# Patient Record
Sex: Female | Born: 1995 | Race: Black or African American
Health system: Southern US, Community
[De-identification: ages and names within clinical notes are randomized; demographics above are authoritative.]

## PROBLEM LIST (undated history)

## (undated) DIAGNOSIS — R1115 Cyclical vomiting syndrome unrelated to migraine: Secondary | ICD-10-CM

## (undated) DIAGNOSIS — Z973 Presence of spectacles and contact lenses: Secondary | ICD-10-CM

## (undated) DIAGNOSIS — T7840XA Allergy, unspecified, initial encounter: Secondary | ICD-10-CM

## (undated) DIAGNOSIS — F329 Major depressive disorder, single episode, unspecified: Secondary | ICD-10-CM

## (undated) DIAGNOSIS — R110 Nausea: Secondary | ICD-10-CM

## (undated) HISTORY — DX: Presence of spectacles and contact lenses: Z97.3

## (undated) HISTORY — DX: Allergy, unspecified, initial encounter: T78.40XA

## (undated) HISTORY — DX: Nausea: R11.0

## (undated) HISTORY — PX: ADENOIDECTOMY: SUR15

---
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
## 2014-03-17 ENCOUNTER — Encounter: Payer: Self-pay | Admitting: Medical

## 2014-03-17 ENCOUNTER — Ambulatory Visit (INDEPENDENT_AMBULATORY_CARE_PROVIDER_SITE_OTHER): Payer: Self-pay | Admitting: Medical

## 2014-03-17 VITALS — BP 110/70 | HR 96 | Temp 98.1°F | Resp 16 | Wt 154.0 lb

## 2014-03-17 DIAGNOSIS — J309 Allergic rhinitis, unspecified: Secondary | ICD-10-CM

## 2014-03-17 DIAGNOSIS — G43009 Migraine without aura, not intractable, without status migrainosus: Secondary | ICD-10-CM

## 2014-03-17 MED ORDER — MOMETASONE FUROATE 50 MCG/ACT NA SUSP: 2.0000 | Freq: Every day | NASAL | Status: AC

## 2014-03-17 MED ORDER — ZOLMITRIPTAN 5 MG PO TABS: 5.0000 mg | ORAL_TABLET | ORAL | Status: AC | PRN

## 2014-03-17 NOTE — Progress Notes (Signed)
Subjective:    Sandra Scott is a 18 y.o. female who presents as a new patient.     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  Needs primary care provider.  Here for hx/o migraines, needs refills.  Gets migraines every now and then, can last 2-3 days.  Had migraine few days ago that has resolved with Advil for migraine  First diagnosed with migraines 2 years ago, has seen both Primary care and neurology in [place].  Was diagnosed with migraines without aura.   Was not prescribed daily preventative mediation, only given Zomig for acute migraine.   zomig usually works ok.   Usually gets a migraine every few months.  Usually gets photophobia, phonophobia, nausea, usually gets temporal or frontal, sometimes both.  Thobbing.  Pain is constant, motion in general can aggravate the headache.  At times has gotten tingling in left foot.  Had been seen in the hospital for 1.5 wk for left leg numbness, rule out stoke, felt like this could have been caused by migraines.  Sometimes with migraines gets dizzy.  Sometime gets migraine the week before her period.  Her migraines triggers are unknown.  The patient denies depression, loss of balance, muscle weakness, speech difficulties and vision problems.  Family history includes no known family members with significant headaches.  Hx/o depression, doing fine currently, in remission, no medication.  Life is good, happy, lives with 3 room mates.  Plans to pursue nursing career.  The following portions of the patient's history were reviewed and updated as appropriate: allergies, current medications, past family history, past medical history, past social history, past surgical history and problem list.  Review of Systems Constitutional: -fever, -chills, -sweats, -unexpected weight change ENT: +runny nose, +head congestion, +sneezing, -ear pain or pressure, -sore throat, -hearing loss, -sinus problems Cardiology:  -chest pain, -palpitations, -edema, -  hx/o pulsating artery in temple Respiratory: -cough, -shortness of breath, -wheezing Gastroenterology: -abdominal pain, -nausea, -vomiting, -diarrhea, -constipation  Hematology: -bleeding or bruising problems Musculoskeletal: - no stiff neck, -arthralgias, -myalgias, -joint swelling, -back pain Ophthalmology: -vision changes, - vision loss, -double vision Urology: -dysuria, -difficulty urinating, -hematuria, -urinary frequency, -urgency Neurology: -weakness, -tingling, -numbness, -speech changes, - facial changes, -urinary or bowel incontinence, -LOC, -altered mental status.   Other: No recent head injury or concussion, no recent fasting from meals, no recent change in caffeine use, headaches are not worsened by exertion Psych: -no recent change in stress level     Objective:   Physical Exam  Filed Vitals:   03/17/14 1353  BP: 110/70  Pulse: 96  Temp: 98.1 F (36.7 C)  Resp: 16    General appearance: alert, no distress, WD/WN HEENT: normocephalic, sclerae anicteric, PERRLA, EOMi, nares patent, no discharge or erythema, pharynx normal Oral cavity: MMM, no lesions Neck: supple, no lymphadenopathy, no thyromegaly, no masses, no bruits Heart: RRR, normal S1, S2, no murmurs Lungs: CTA bilaterally, no wheezes, rhonchi, or rales Extremities: unremarkable Pulses: 2+ symmetric, upper and lower extremities Neurological: alert, oriented x 3, CN2-12 intact, strength normal upper extremities and lower extremities, sensation normal throughout, DTRs 2+ throughout, no cerebellar signs, gait normal Psychiatric: normal affect, behavior normal, pleasant     Assessment:   Encounter Diagnoses  Name Primary?  . Migraine without aura and without status migrainosus, not intractable Yes  . Allergic rhinitis, unspecified allergic rhinitis type       Plan:   Migraines - discussed diagnosis, treatment recommendations, prevention, headache diary.  Advised c/t  Zomig for acute migraines, discussed  proper use, motrin the week before periods, c/t daily allergy medications, and f/u if worsening frequency or intractable migraines  Allergic rhinitis - c/t daily OTC ant histamine, trial of Nasonex spray.   F/u with call back in 3-4 wk.

## 2015-04-18 IMAGING — CR DG LUMBAR SPINE COMPLETE 4+V
5 series · 5 of 5 positions shown · non-contrast
Comparison: None.

CLINICAL DATA: Low back pain, no injury

EXAM:
LUMBAR SPINE - COMPLETE 4+ VIEW

[t lumbar spine ap]
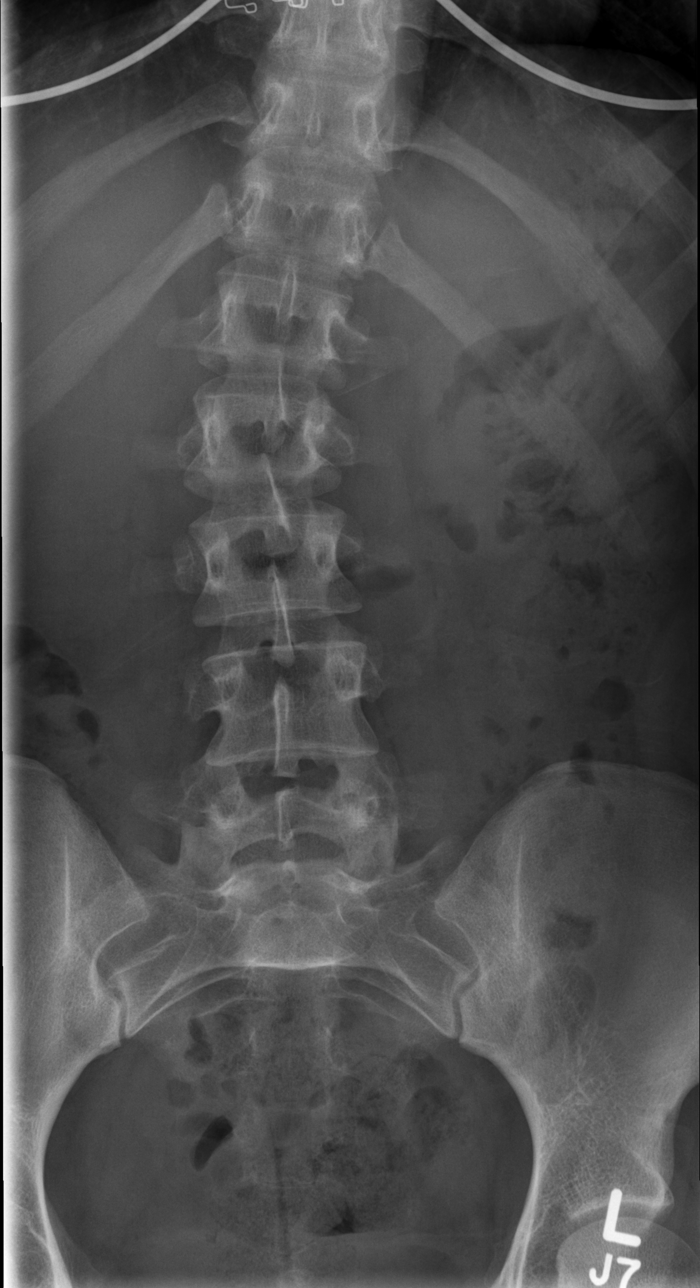

[t lumbar spine obl (1 of 2)]
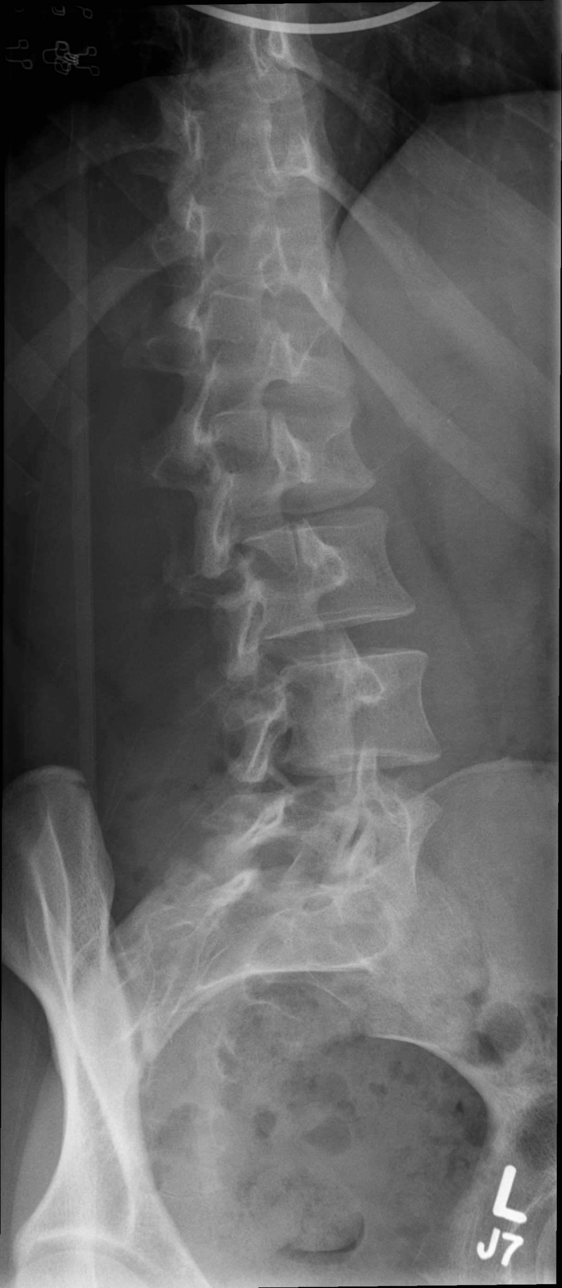

[t lumbar spine lat]
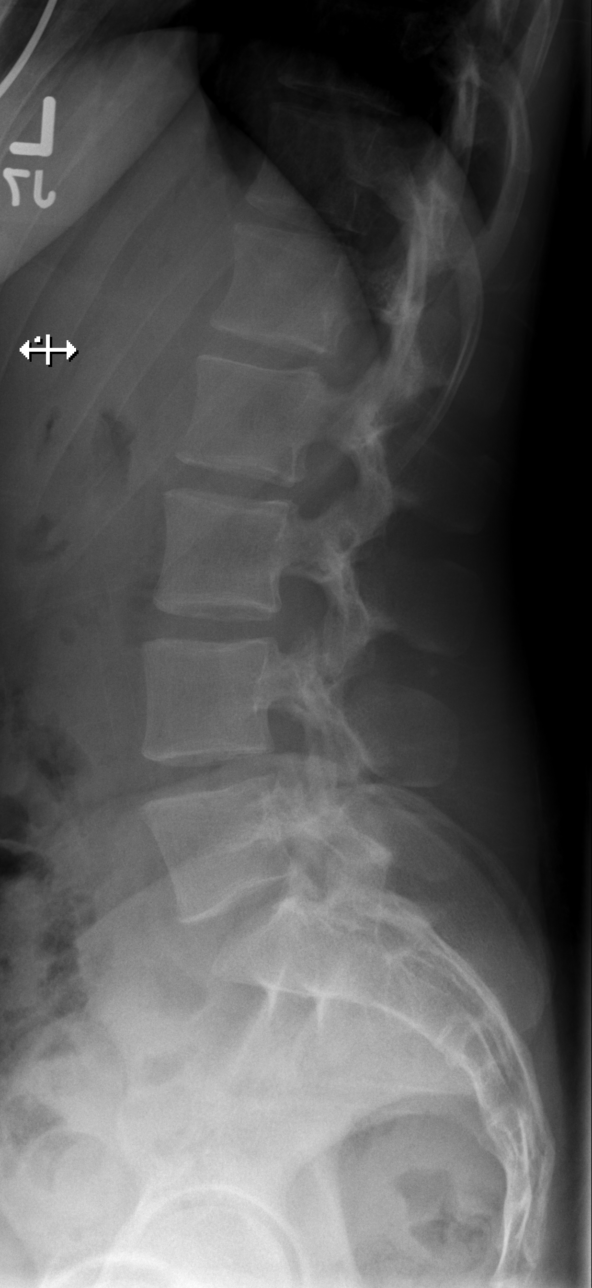

[t lumbar l-5 s-1 spot]
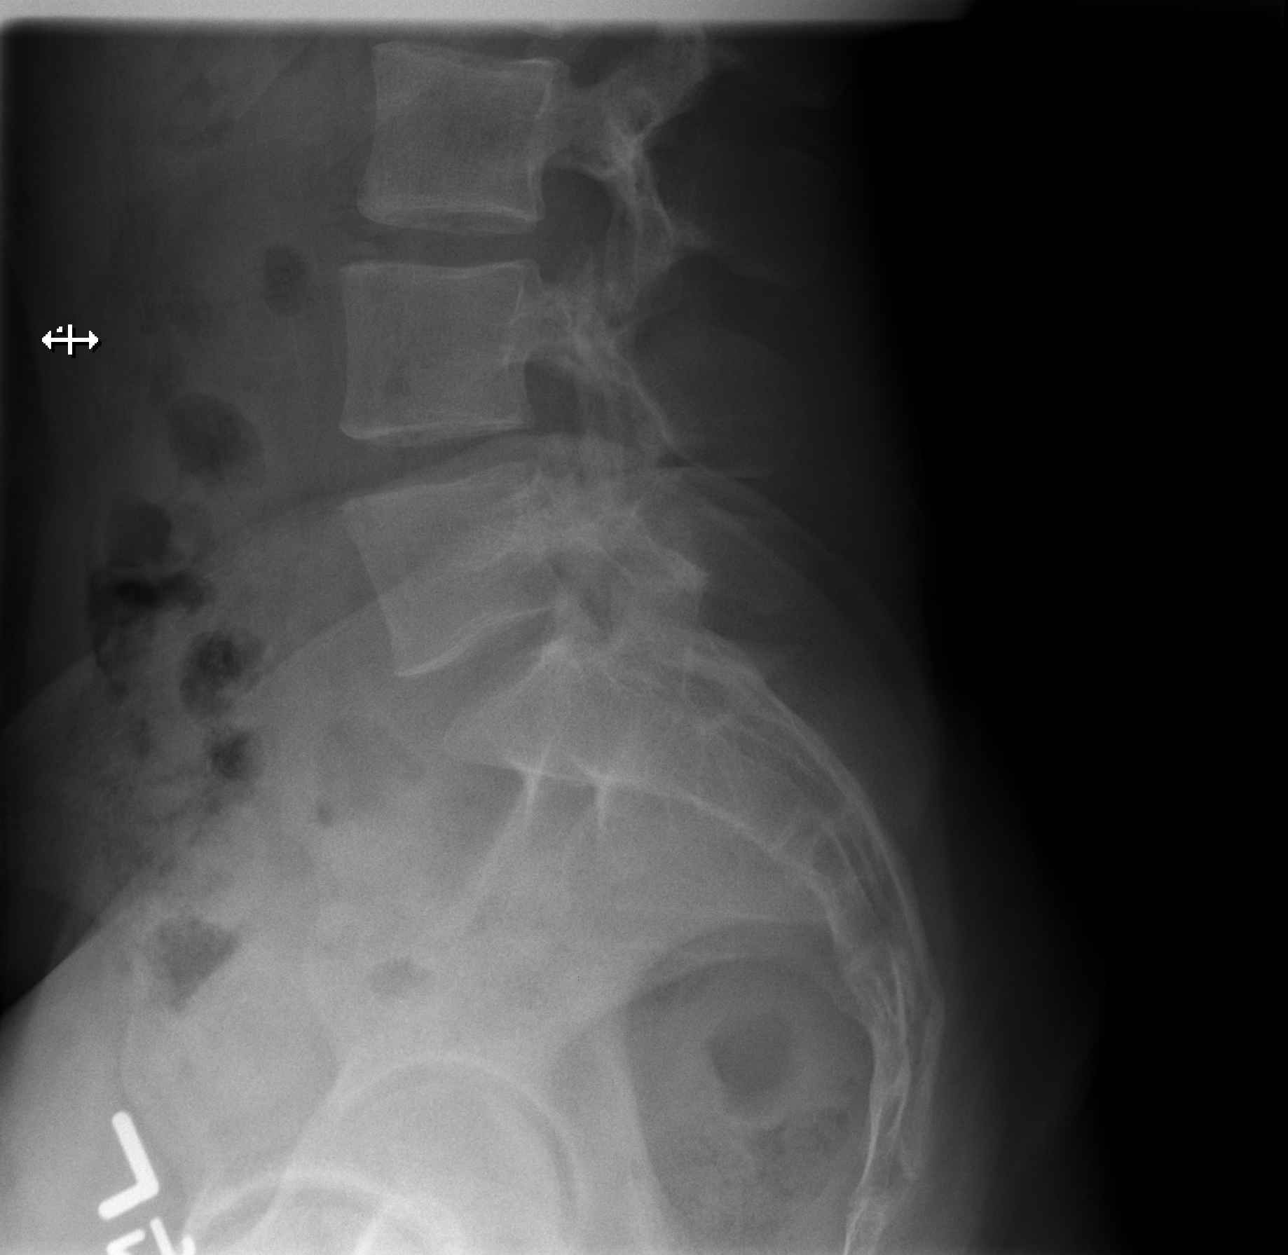

[t lumbar spine obl (2 of 2)]
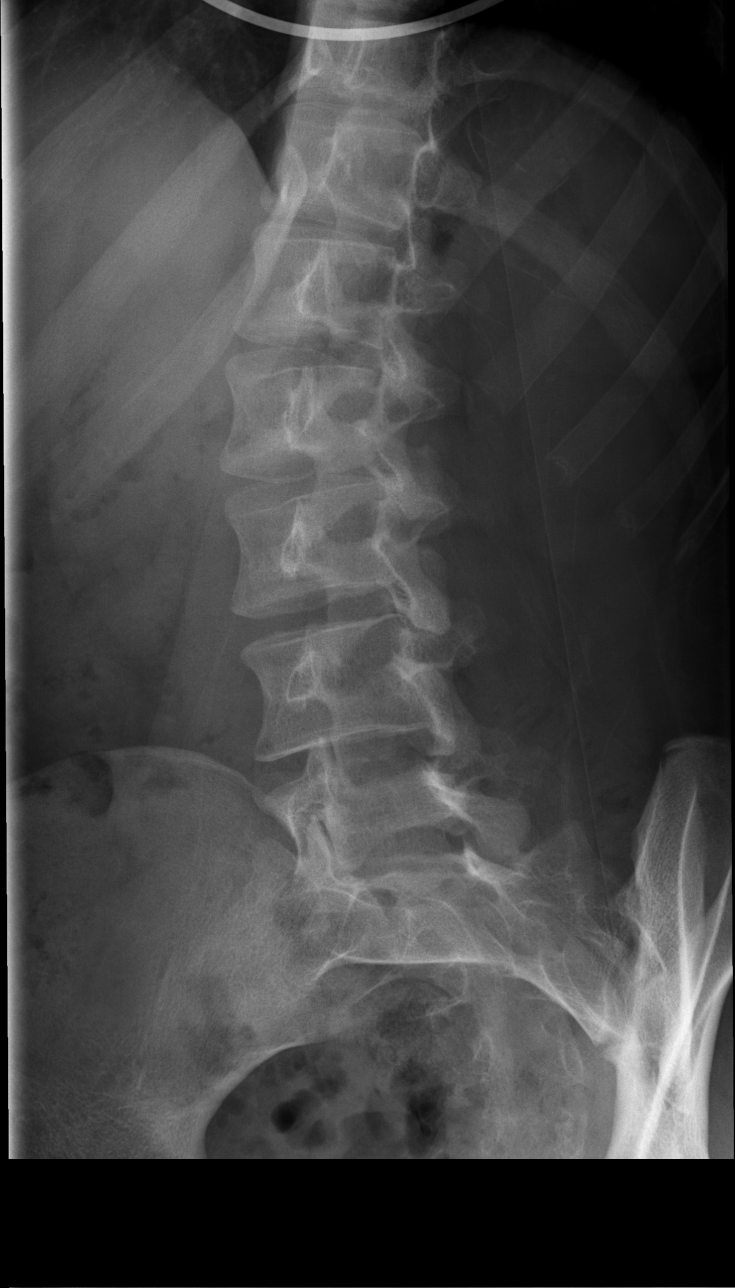

[5 of 5 positions shown; findings below may reference images not displayed]

FINDINGS: 12 degrees of dextroscoliosis at L2. No focal vertebral body
abnormality.

Negative for fracture or mass. Normal alignment and no significant
disc degeneration. Negative for pars defect.
IMPRESSION: Mild dextroscoliosis.

## 2021-01-07 ENCOUNTER — Emergency Department: Payer: Self-pay

## 2021-01-07 ENCOUNTER — Emergency Department
Admission: EM | Admit: 2021-01-07 | Discharge: 2021-01-07 | Disposition: A | Discharge status: Home / Self Care | Payer: Self-pay | Attending: Emergency Medicine | Admitting: Emergency Medicine

## 2021-01-07 DIAGNOSIS — E860 Dehydration: Secondary | ICD-10-CM | POA: Insufficient documentation

## 2021-01-07 DIAGNOSIS — R112 Nausea with vomiting, unspecified: Secondary | ICD-10-CM | POA: Insufficient documentation

## 2021-01-07 LAB — CBC AND DIFFERENTIAL
Absolute NRBC: 0 10*3/uL (ref 0.00–0.00)
Basophils Absolute Automated: 0.03 10*3/uL (ref 0.00–0.08)
Basophils Automated: 0.2 %
Eosinophils Absolute Automated: 0.03 10*3/uL (ref 0.00–0.44)
Eosinophils Automated: 0.2 %
Hematocrit: 41.2 % (ref 34.7–43.7)
Hgb: 14.5 g/dL (ref 11.4–14.8)
Immature Granulocytes Absolute: 0.04 10*3/uL (ref 0.00–0.07)
Immature Granulocytes: 0.3 %
Lymphocytes Absolute Automated: 3.06 10*3/uL (ref 0.42–3.22)
Lymphocytes Automated: 25.1 %
MCH: 31.7 pg (ref 25.1–33.5)
MCHC: 35.2 g/dL (ref 31.5–35.8)
MCV: 90 fL (ref 78.0–96.0)
MPV: 9.5 fL (ref 8.9–12.5)
Monocytes Absolute Automated: 0.76 10*3/uL (ref 0.21–0.85)
Monocytes: 6.2 %
Neutrophils Absolute: 8.28 10*3/uL — ABNORMAL HIGH (ref 1.10–6.33)
Neutrophils: 68 %
Nucleated RBC: 0 /100 WBC (ref 0.0–0.0)
Platelets: 325 10*3/uL (ref 142–346)
RBC: 4.58 10*6/uL (ref 3.90–5.10)
RDW: 12 % (ref 11–15)
WBC: 12.2 10*3/uL — ABNORMAL HIGH (ref 3.10–9.50)

## 2021-01-07 LAB — COMPREHENSIVE METABOLIC PANEL
ALT: 19 U/L (ref 0–55)
AST (SGOT): 24 U/L (ref 5–34)
Albumin/Globulin Ratio: 1.2 (ref 0.9–2.2)
Albumin: 4.2 g/dL (ref 3.5–5.0)
Alkaline Phosphatase: 39 U/L (ref 37–117)
Anion Gap: 16 — ABNORMAL HIGH (ref 5.0–15.0)
BUN: 9 mg/dL (ref 7.0–19.0)
Bilirubin, Total: 0.6 mg/dL (ref 0.2–1.2)
CO2: 15 mEq/L — ABNORMAL LOW (ref 22–29)
Calcium: 9.6 mg/dL (ref 8.5–10.5)
Chloride: 104 mEq/L (ref 100–111)
Creatinine: 0.8 mg/dL (ref 0.6–1.0)
Globulin: 3.6 g/dL (ref 2.0–3.6)
Glucose: 106 mg/dL — ABNORMAL HIGH (ref 70–100)
Potassium: 3.6 mEq/L (ref 3.5–5.1)
Protein, Total: 7.8 g/dL (ref 6.0–8.3)
Sodium: 135 mEq/L — ABNORMAL LOW (ref 136–145)

## 2021-01-07 LAB — URINALYSIS REFLEX TO MICROSCOPIC EXAM - REFLEX TO CULTURE
Bilirubin, UA: NEGATIVE
Glucose, UA: NEGATIVE
Ketones UA: >=80 — AB
Leukocyte Esterase, UA: NEGATIVE
Nitrite, UA: NEGATIVE
Protein, UR: 30 — AB
Specific Gravity UA: 1.031 (ref 1.001–1.035)
Urine pH: 6 (ref 5.0–8.0)
Urobilinogen, UA: NORMAL mg/dL (ref 0.2–2.0)

## 2021-01-07 LAB — POCT PREGNANCY TEST, URINE HCG: POCT Pregnancy HCG Test, UR: NEGATIVE

## 2021-01-07 LAB — COVID-19 (SARS-COV-2) & INFLUENZA  A/B, NAA (ROCHE LIAT)
Influenza A: NOT DETECTED
Influenza B: NOT DETECTED
SARS CoV 2 Overall Result: NOT DETECTED

## 2021-01-07 LAB — GFR: EGFR: >60

## 2021-01-07 LAB — LIPASE: Lipase: 30 U/L (ref 8–78)

## 2021-01-07 MED ORDER — ONDANSETRON HCL 4 MG/2ML IJ SOLN
4.0000 mg | Freq: Once | INTRAMUSCULAR | Status: AC
Start: 2021-01-07 — End: 2021-01-07
  Administered 2021-01-07: 4 mg via INTRAVENOUS
  Filled 2021-01-07: qty 2

## 2021-01-07 MED ORDER — ONDANSETRON 4 MG PO TBDP
4.0000 mg | ORAL_TABLET | Freq: Four times a day (QID) | ORAL | 0 refills | Status: AC | PRN
Start: 2021-01-07 — End: ?

## 2021-01-07 MED ORDER — SODIUM CHLORIDE 0.9 % IV BOLUS
1000.0000 mL | Freq: Once | INTRAVENOUS | Status: AC
Start: 2021-01-07 — End: 2021-01-07
  Administered 2021-01-07: 1000 mL via INTRAVENOUS

## 2021-01-07 MED ORDER — ONDANSETRON 4 MG PO TBDP
4.0000 mg | ORAL_TABLET | Freq: Four times a day (QID) | ORAL | 0 refills | Status: DC | PRN
Start: 2021-01-07 — End: 2021-01-07

## 2021-01-07 MED ORDER — KETOROLAC TROMETHAMINE 30 MG/ML IJ SOLN
15.0000 mg | Freq: Once | INTRAMUSCULAR | Status: AC
Start: 2021-01-07 — End: 2021-01-07
  Administered 2021-01-07: 15 mg via INTRAVENOUS
  Filled 2021-01-07: qty 1

## 2021-01-07 MED ORDER — ACETAMINOPHEN 500 MG PO TABS
1000.0000 mg | ORAL_TABLET | Freq: Once | ORAL | Status: DC
Start: 2021-01-07 — End: 2021-01-07

## 2021-01-07 MED ORDER — IOHEXOL 350 MG/ML IV SOLN
80.0000 mL | Freq: Once | INTRAVENOUS | Status: AC | PRN
Start: 2021-01-07 — End: 2021-01-07
  Administered 2021-01-07: 80 mL via INTRAVENOUS

## 2021-01-07 NOTE — ED Notes (Signed)
Bed: N 42  Expected date:   Expected time:   Means of arrival:   Comments:  RN has T & T2

## 2021-01-07 NOTE — ED Provider Notes (Signed)
[facility] EMERGENCY DEPARTMENT APP H&P         CLINICAL SUMMARY          Diagnosis:    .     Final diagnoses:   Nausea and vomiting, unspecified vomiting type   Dehydration         MDM Notes:      Carmen Jacobs is a 25 y.o. female w/ no pmhx who presents with nausea, vomiting, abdominal pain underneath her belly button x 2 days.     DDx-              Initial differential diagnosis included: Viral gastroenteritis, appendicitis, diverticulitis, colitis, pregnancy, electrolyte derangements, dehydration    Discussed with attending MD Mat Carne.   Patient appearing nontoxic.  Afebrile, vital signs stable.  Abdomen soft, mild diffuse lower abdomen tenderness.   Labs WBC 12K, normal renal function.   COVID/flu negative.  UA w/o infection.  Signed out to Dr. Mat Carne pending CTAP.    Counseling: I have spoken with the patient and discussed today's findings, in addition to providing specific details for the plan of care. Questions are answered and the patient agrees with the plan.     This patient was seen and evaluated during the SARS-CoV-2 pandemic.         Disposition:       ED Disposition       ED Disposition   Discharge    Condition   --    Date/Time   Fri Jan 07, 2021  6:16 AM    Comment   Joice Lofts Duffy Bruce discharge to home/self care.    Condition at disposition: Stable                 Discharge         Discharge Prescriptions       Medication Sig Dispense Auth. Provider    ondansetron (ZOFRAN-ODT) 4 MG disintegrating tablet  (Status: Discontinued) Take 1 tablet (4 mg total) by mouth every 6 (six) hours as needed for Nausea 6 tablet Rudi Rummage, MD    ondansetron (ZOFRAN-ODT) 4 MG disintegrating tablet  (Status: Discontinued) Take 1 tablet (4 mg total) by mouth every 6 (six) hours as needed for Nausea 8 tablet Rudi Rummage, MD    ondansetron (ZOFRAN-ODT) 4 MG disintegrating tablet Take 1 tablet (4 mg total) by mouth every 6 (six) hours as needed for Nausea 8 tablet Rudi Rummage, MD                         CLINICAL INFORMATION        HPI:      Chief Complaint: Emesis, Diarrhea, and Dehydration  .    Carmen Jacobs is a 25 y.o. female w/ no pmhx who presents with nausea, vomiting, abdominal pain underneath her belly button x 2 days.   Pt states that since she was 25 years old, she would have episodes where she wakes up and feels nauseated and vomits.  This happens about 2-3 times a year.  She has had several work-ups including endoscopies and nobody has found a cause for this.  2 days ago, patient woke up and had nausea.  She has been vomiting since. She also reports diarrhea.   She states that this is different from prior episodes because they usually resolve same day, but this has been persistent for the past 2 days.  Patient is unable to keep anything down.  She feels dehydrated.  Endorsed sweating but denies fever.  Denies chest pain or shortness of breath.  Denies dysuria, urinary frequency.    No hx of abdominal surgeries.     No covid vaccine.    Occasional alcohol use.  Admits to marijuana use. Last use this last Saturday/Sunday.       History obtained from: Patient      ROS:      Review of Systems      Positive and negative ROS elements as per HPI.  All other systems reviewed and negative.      Physical Exam:      Pulse 78  BP 105/59  Resp (!) 24  SpO2 94 %  Temp 99.3 F (37.4 C)    Physical Exam  Vitals and nursing note reviewed.   Constitutional:       General: She is not in acute distress.     Appearance: She is well-developed. She is not diaphoretic.   HENT:      Head: Normocephalic and atraumatic.      Nose: Nose normal.   Eyes:      General: No scleral icterus.     Extraocular Movements: Extraocular movements intact.      Conjunctiva/sclera: Conjunctivae normal.   Neck:      Trachea: No tracheal deviation.   Cardiovascular:      Rate and Rhythm: Normal rate and regular rhythm.      Heart sounds: Normal heart sounds. No murmur heard.    No friction rub.   Pulmonary:      Effort:  Pulmonary effort is normal. No respiratory distress.      Breath sounds: Normal breath sounds.   Abdominal:      General: There is no distension.      Palpations: Abdomen is soft.      Tenderness: There is abdominal tenderness (mild diffuse lower abdominal tenderness). There is no guarding.   Musculoskeletal:         General: No tenderness. Normal range of motion.      Cervical back: Normal range of motion and neck supple.   Skin:     General: Skin is warm and dry.      Findings: No erythema or rash.   Neurological:      Mental Status: She is alert and oriented to person, place, and time.      Motor: No abnormal muscle tone.      Coordination: Coordination normal.   Psychiatric:         Mood and Affect: Mood normal.         Behavior: Behavior normal.                 PAST HISTORY        Primary Care Provider: Pcp, None, MD        PMH/PSH:    .     History reviewed. No pertinent past medical history.    She has no past surgical history on file.      Social/Family History:      She has no history on file for tobacco use, alcohol use, and drug use.    History reviewed. No pertinent family history.      Listed Medications on Arrival:    .     Discharge Medication List as of 01/07/2021  6:16 AM         Allergies: She has No Known Allergies.  VISIT INFORMATION        Clinical Course in the ED:          ED Course as of 01/10/21 1614   Fri Jan 07, 2021   0553 Tolerated PO   [SC]   1610 Is visiting from [place] and will follow up with GI there.  [SC]      ED Course User Index  [SC] Rogelia Rohrer         Medications Given in the ED:    .     ED Medication Orders (From admission, onward)      Start Ordered     Status Ordering Provider    01/07/21 [id number] 01/07/21 0555  ketorolac (TORADOL) injection 15 mg  Once        Route: Intravenous  Ordered Dose: 15 mg     Last MAR action: Given CLAY, MARLEINE S    01/07/21 0555 01/07/21 0554    Once        Route: Oral  Ordered Dose: 1,000 mg     Discontinued CLAY, MARLEINE S     01/07/21 0441 01/07/21 0441  iohexol (OMNIPAQUE) 350 MG/ML injection 80 mL  IMG once as needed        Route: Intravenous  Ordered Dose: 80 mL     Last MAR action: Imaging Agent Given Sofie Hartigan S    01/07/21 0052 01/07/21 0051  ondansetron (ZOFRAN) injection 4 mg  Once        Route: Intravenous  Ordered Dose: 4 mg     Last MAR action: Given CLAY, MARLEINE S    01/07/21 0052 01/07/21 0051  sodium chloride 0.9 % bolus 1,000 mL  Once        Route: Intravenous  Ordered Dose: 1,000 mL     Last MAR action: Stopped CLAY, MARLEINE S              Procedures:      Procedures      Interpretations:      O2 sat-           saturation: 94 %; Oxygen use: room air; Interpretation: Normal                   RESULTS        Lab Results:      Results       Procedure Component Value Units Date/Time    COVID-19 (SARS-CoV-2) and Influenza A/B, NAA (L[place] Rapid)- Age 1 and above [[id number]] Collected: 01/07/21 0349    Specimen: Culturette from Nasopharyngeal Updated: 01/07/21 0433     Purpose of COVID testing Diagnostic -PUI     SARS-CoV-2 Specimen Source Nasal Swab     SARS CoV 2 Overall Result Not Detected     Influenza A Not Detected     Influenza B Not Detected    Narrative:      o Collect and clearly label specimen type:  o PREFERRED-Upper respiratory specimen: One Nasal Swab in  Transport Media.  o Hand deliver to laboratory ASAP  Diagnostic -PUI    Urinalysis Reflex to Microscopic Exam- Reflex to Culture [[id number]]  (Abnormal) Collected: 01/07/21 0349     Updated: 01/07/21 0425     Urine Type Urine, Clean Ca     Color, UA Yellow     Clarity, UA Clear     Specific Gravity UA 1.031     Urine pH 6.0     Leukocyte Esterase, UA Negative  Nitrite, UA Negative     Protein, UR 30     Glucose, UA Negative     Ketones UA >=80     Urobilinogen, UA Normal mg/dL      Bilirubin, UA Negative     Blood, UA Moderate     RBC, UA 6 - 10 /hpf      WBC, UA 0 - 5 /hpf      Squamous Epithelial Cells, Urine 0 - 5 /hpf      Urine Mucus Present    Urine  HCG, POC/ Qualitative [[id number]] Collected: 01/07/21 0351    Specimen: Urine Updated: 01/07/21 0355     POCT QC Pass     POCT Pregnancy HCG Test, UR Negative     Comment: Negative Value is Normal in Healthy Males or Healthy non-pregnant Females    GFR [[id number]] Collected: 01/07/21 0056     Updated: 01/07/21 0144     EGFR >60.0       Comprehensive metabolic panel [[id number]]  (Abnormal) Collected: 01/07/21 0056    Specimen: Blood Updated: 01/07/21 0144     Glucose 106 mg/dL      BUN 9.0 mg/dL      Creatinine 0.8 mg/dL      Sodium 086 mEq/L      Potassium 3.6 mEq/L      Chloride 104 mEq/L      CO2 15 mEq/L      Calcium 9.6 mg/dL      Protein, Total 7.8 g/dL      Albumin 4.2 g/dL      AST (SGOT) 24 U/L      ALT 19 U/L      Alkaline Phosphatase 39 U/L      Bilirubin, Total 0.6 mg/dL      Globulin 3.6 g/dL      Albumin/Globulin Ratio 1.2     Anion Gap 16.0    Lipase [[id number]] Collected: 01/07/21 0056    Specimen: Blood Updated: 01/07/21 0144     Lipase 30 U/L     CBC and differential [[id number]]  (Abnormal) Collected: 01/07/21 0056    Specimen: Blood Updated: 01/07/21 0127     WBC 12.20 x10 3/uL      Hgb 14.5 g/dL      Hematocrit 01.0 %      Platelets 325 x10 3/uL      RBC 4.58 x10 6/uL      MCV 90.0 fL      MCH 31.7 pg      MCHC 35.2 g/dL      RDW 12 %      MPV 9.5 fL      Neutrophils 68.0 %      Lymphocytes Automated 25.1 %      Monocytes 6.2 %      Eosinophils Automated 0.2 %      Basophils Automated 0.2 %      Immature Granulocytes 0.3 %      Nucleated RBC 0.0 /100 WBC      Neutrophils Absolute 8.28 x10 3/uL      Lymphocytes Absolute Automated 3.06 x10 3/uL      Monocytes Absolute Automated 0.76 x10 3/uL      Eosinophils Absolute Automated 0.03 x10 3/uL      Basophils Absolute Automated 0.03 x10 3/uL      Immature Granulocytes Absolute 0.04 x10 3/uL      Absolute NRBC 0.00 x10 3/uL  Radiology Results:      CT Abd/Pelvis with IV Contrast only   Final Result      No acute abnormality. Appendix is  not visualized.      Debera Lat Merchant    01/07/2021 5:17 AM                  Scribe Attestation:      No scribe involved in the care of this patient              Robin Searing, [place]  01/10/21 1623

## 2021-01-07 NOTE — ED Triage Notes (Signed)
Patient reports nausea, vomiting and diarrhea for two days and unable to keep anything down. Patient took Zofran odt at home without relief.

## 2021-01-07 NOTE — ED Provider Notes (Signed)
[facility] EMERGENCY DEPARTMENT H&P                                             ATTENDING SUPERVISORY NOTE       ATTENDING NOTE        Carmen Jacobs is a 25 y.o. female with no PMHx on file who presents with moderate persistent nausea and vomiting a/w diarrhea for the last 2 days. Says that since she was young she has had 2-3 episodes a year where she wakes up feeling nauseous, vomits, and then improves.   Reports that this time is different because her symptoms hasn't stopped. Endorses diaphoresis and lower abdominal pain. Denies fever, chills, or urinary sx.     WBC 12.20  Hb 14.5  Cr 0.8  Na 135  U/a: no UTI.   COVID-19 and Flu:  negative   CT a/p:   No acute abnormality. Appendix is not visualized.    Pt improved after IVF and antiemetics.  She tolerated PO intake in ED.  D/c w/ Rx for Zofran.  She will f/u with GI from [place].   Is visiting from [place].   HR 88 prior to d/c.     Myself and staff explained that not all medical issues can be diagnosed, evaluated, or managed in the emergency department and reiterated the importance of outpatient follow-up. Safe and stable for discharge home as discussed. Strict return precautions were discussed and provided to patient. Patient verbalized understanding and agreement with plan.      I spoke to and examined the patient as well: Yes  I was present during key portions of any procedures performed: N/A            VISIT INFORMATION        Clinical Course in the ED:            ED Course as of 01/10/21 1443   Fri Jan 07, 2021   0553 Tolerated PO   [SC]   5621 Is visiting from [place] and will follow up with GI there.  [SC]      ED Course User Index  [SC] Rogelia Rohrer         Medications Given in the ED:    .     ED Medication Orders (From admission, onward)      Start Ordered     Status Ordering Provider    01/07/21 0556 01/07/21 0555  ketorolac (TORADOL) injection 15 mg  Once        Route: Intravenous   Ordered Dose: 15 mg     Last MAR action: Given Meet Weathington S    01/07/21 0555 01/07/21 0554    Once        Route: Oral  Ordered Dose: 1,000 mg     Discontinued Kenli Waldo S    01/07/21 0441 01/07/21 0441  iohexol (OMNIPAQUE) 350 MG/ML injection 80 mL  IMG once as needed        Route: Intravenous  Ordered Dose: 80 mL     Last MAR action: Imaging Agent Given Sofie Hartigan S    01/07/21 0052 01/07/21 0051  ondansetron (ZOFRAN) injection 4 mg  Once        Route: Intravenous  Ordered Dose: 4 mg     Last MAR action: Given Avilene Marrin S  01/07/21 0052 01/07/21 0051  sodium chloride 0.9 % bolus 1,000 mL  Once        Route: Intravenous  Ordered Dose: 1,000 mL     Last MAR action: Stopped Latayvia Mandujano S              Procedures:            Interpretations:      O2 sat-           saturation: 94 %; Oxygen use: room air; Interpretation: borderline normal       O2 sat-           saturation: 99 %; Oxygen use: room air; Interpretation: normal               PAST HISTORY        Primary Care Provider: Pcp, None, MD        PMH/PSH:    .     History reviewed. No pertinent past medical history.    She has no past surgical history on file.      Social/Family History:      She has no history on file for tobacco use, alcohol use, and drug use.    History reviewed. No pertinent family history.      Listed Medications on Arrival:    .     Home Medications       No Medications           Allergies: She has No Known Allergies.            RESULTS        Lab Results:      Results       Procedure Component Value Units Date/Time    COVID-19 (SARS-CoV-2) and Influenza A/B, NAA ([place] Rapid)- Age 60 and above [[id number]] Collected: 01/07/21 0349    Specimen: Culturette from Nasopharyngeal Updated: 01/07/21 0433     Purpose of COVID testing Diagnostic -PUI     SARS-CoV-2 Specimen Source Nasal Swab     SARS CoV 2 Overall Result Not Detected     Influenza A Not Detected     Influenza B Not Detected    Narrative:      o Collect and clearly  label specimen type:  o PREFERRED-Upper respiratory specimen: One Nasal Swab in  Transport Media.  o Hand deliver to laboratory ASAP  Diagnostic -PUI    Urinalysis Reflex to Microscopic Exam- Reflex to Culture [[id number]]  (Abnormal) Collected: 01/07/21 0349     Updated: 01/07/21 0425     Urine Type Urine, Clean Ca     Color, UA Yellow     Clarity, UA Clear     Specific Gravity UA 1.031     Urine pH 6.0     Leukocyte Esterase, UA Negative     Nitrite, UA Negative     Protein, UR 30     Glucose, UA Negative     Ketones UA >=80     Urobilinogen, UA Normal mg/dL      Bilirubin, UA Negative     Blood, UA Moderate     RBC, UA 6 - 10 /hpf      WBC, UA 0 - 5 /hpf      Squamous Epithelial Cells, Urine 0 - 5 /hpf      Urine Mucus Present    Urine HCG, POC/ Qualitative [[id number]] Collected: 01/07/21 0351    Specimen: Urine Updated: 01/07/21 0355     POCT QC  Pass     POCT Pregnancy HCG Test, UR Negative     Comment: Negative Value is Normal in Healthy Males or Healthy non-pregnant Females    GFR [[id number]] Collected: 01/07/21 0056     Updated: 01/07/21 0144     EGFR >60.0       Comprehensive metabolic panel [[id number]]  (Abnormal) Collected: 01/07/21 0056    Specimen: Blood Updated: 01/07/21 0144     Glucose 106 mg/dL      BUN 9.0 mg/dL      Creatinine 0.8 mg/dL      Sodium 782 mEq/L      Potassium 3.6 mEq/L      Chloride 104 mEq/L      CO2 15 mEq/L      Calcium 9.6 mg/dL      Protein, Total 7.8 g/dL      Albumin 4.2 g/dL      AST (SGOT) 24 U/L      ALT 19 U/L      Alkaline Phosphatase 39 U/L      Bilirubin, Total 0.6 mg/dL      Globulin 3.6 g/dL      Albumin/Globulin Ratio 1.2     Anion Gap 16.0    Lipase [[id number]] Collected: 01/07/21 0056    Specimen: Blood Updated: 01/07/21 0144     Lipase 30 U/L     CBC and differential [[id number]]  (Abnormal) Collected: 01/07/21 0056    Specimen: Blood Updated: 01/07/21 0127     WBC 12.20 x10 3/uL      Hgb 14.5 g/dL      Hematocrit 52.8 %      Platelets 325 x10 3/uL      RBC 4.58 x10  6/uL      MCV 90.0 fL      MCH 31.7 pg      MCHC 35.2 g/dL      RDW 12 %      MPV 9.5 fL      Neutrophils 68.0 %      Lymphocytes Automated 25.1 %      Monocytes 6.2 %      Eosinophils Automated 0.2 %      Basophils Automated 0.2 %      Immature Granulocytes 0.3 %      Nucleated RBC 0.0 /100 WBC      Neutrophils Absolute 8.28 x10 3/uL      Lymphocytes Absolute Automated 3.06 x10 3/uL      Monocytes Absolute Automated 0.76 x10 3/uL      Eosinophils Absolute Automated 0.03 x10 3/uL      Basophils Absolute Automated 0.03 x10 3/uL      Immature Granulocytes Absolute 0.04 x10 3/uL      Absolute NRBC 0.00 x10 3/uL                 Radiology Results:      CT Abd/Pelvis with IV Contrast only   Final Result      No acute abnormality. Appendix is not visualized.      Debera Lat Merchant    01/07/2021 5:17 AM                  Attending Attestation:      The patient was seen and examined by the mid-level (physician assistant or nurse practitioner), or fellow, and the plan of care was discussed with me. I agree with the plan as it was presented to me.  I have reviewed and agree with the final  ED diagnosis.              Scribe Attestation:      I was acting as a Neurosurgeon for Rudi Rummage, MD on [facility]   Treatment Team: Scribe: Lequita Halt     I am the first provider for this patient and I personally performed the services documented. Treatment Team: Scribe: Lequita Halt is scribing for me on [facility] .This note and the patient instructions accurately reflect work and decisions made by me.  Rudi Rummage, MD              Rudi Rummage, MD  01/10/21 [phone]

## 2021-01-07 NOTE — Discharge Instructions (Addendum)
Dear [name]:    Thank you for choosing the [facility] Emergency Department, the premier emergency department in the [place] area.  I hope your visit today was EXCELLENT. You will receive a survey via text message that will give you the opportunity to provide feedback to your team about your visit. Please do not hesitate to reach out with any questions!    Specific instructions for your visit today:  Please return to the ER if you develop severe pain, persistent vomiting, fever, or any other  concerning symptoms.   Please follow up with your gastroenterologist in [place].      IF YOU DO NOT CONTINUE TO IMPROVE OR YOUR CONDITION WORSENS, PLEASE CONTACT YOUR DOCTOR OR RETURN IMMEDIATELY TO THE EMERGENCY DEPARTMENT.    Sincerely,  Rudi Rummage, MD  Attending Emergency Physician  [facility] Emergency Department      OBTAINING A PRIMARY CARE APPOINTMENT    Primary care physicians (PCPs, also known as primary care doctors) are either internists or family medicine doctors. Both types of PCPs focus on health promotion, disease prevention, patient education and counseling, and treatment of acute and chronic medical conditions.    If you need a primary care doctor, please call the below number and ask who is receiving new patients.     [facility]  Telephone:  [phone]  [email or web address]    DOCTOR REFERRALS  Call [phone] (available 24 hours a day, 7 days a week) if you need any further referrals and we can help you find a primary care doctor or specialist.  Also, available online at:  [email or web address]    YOUR CONTACT INFORMATION  Before leaving please check with registration to make sure we have an up-to-date contact number.  You can call registration at [phone] to update your information.  For questions about your hospital bill, please call [phone].  For questions about your Emergency Dept Physician bill please call [phone].       FREE HEALTH SERVICES  If you need help with health or social services, please call 2-1-1 for a free referral to resources in your area.  2-1-1 is a free service connecting people with information on health insurance, free clinics, pregnancy, mental health, dental care, food assistance, housing, and substance abuse counseling.  Also, available online at:  http://www.[place].org    ORTHOPEDIC INJURY   Please know that significant injuries can exist even when an initial x-ray is read as normal or negative.  This can occur because some fractures (broken bones) are not initially visible on x-rays.  For this reason, close outpatient follow-up with your primary care doctor or bone specialist (orthopedist) is required.    MEDICATIONS AND FOLLOWUP  Please be aware that some prescription medications can cause drowsiness.  Use caution when driving or operating machinery.    The examination and treatment you have received in our Emergency Department is provided on an emergency basis, and is not intended to be a substitute for your primary care physician.  It is important that your doctor checks you again and that you report any new or remaining problems at that time.      ASSISTANCE WITH INSURANCE    Affordable Care Act  [facility])  Call to start or finish an application, compare plans, enroll or ask a question.  [phone]  TTY: [phone]  Web:  Healthcare.gov    Help Enrolling in [facility]  Cover [place]  409-127-3382 (TOLL-FREE)  ([phone] (TTY)  Web:  Http://www.coverva.org    Local Help Enrolling in the [place]  (612) 637-3454 (MAIN)  Email:  health-help'@nvfs'$ .org  Web:  http://lewis-perez.info/  Address:  [address], [id number] [place], [place] [address]    SEDATING MEDICATIONS  Sedating medications include strong pain medications (e.g. narcotics), muscle relaxers, benzodiazepines (used for anxiety and as muscle relaxers), Benadryl/diphenhydramine and other antihistamines for  allergic reactions/itching, and other medications.  If you are unsure if you have received a sedating medication, please ask your physician or nurse.  If you received a sedating medication: DO NOT drive a car. DO NOT operate machinery. DO NOT perform jobs where you need to be alert.  DO NOT drink alcoholic beverages while taking this medicine.     If you get dizzy, sit or lie down at the first signs. Be careful going up and down stairs.  Be extra careful to prevent falls.     Never give this medicine to others.     Keep this medicine out of reach of children.     Do not take or save old medicines. Throw them away when outdated.     Keep all medicines in a cool, dry place. DO NOT keep them in your bathroom medicine cabinet or in a cabinet above the stove.    MEDICATION REFILLS  Please be aware that we cannot refill any prescriptions through the ER. If you need further treatment from what is provided at your ER visit, please follow up with your primary care doctor or your pain management specialist.    [place]  Did you know [facility] has two freestanding ERs located just a few miles away?  [facility] [place] have short wait times, easy free parking directly in front of the building and top patient satisfaction scores - and the same Board Certified Emergency Medicine doctors as [facility].

## 2021-03-07 NOTE — ED Provider Notes (Signed)
[facility] Emergency Department Provider Note  Room: C 07/CAH07    Medical Decision Making     Carmen Jacobs is a 25 y.o. female who presents to the emergency department for evaluation of recurrent nausea and vomiting.  History of the same and reports that she has been diagnosed with abdominal migraines.  She also smokes marijuana.  Has had 2 days of nausea and vomiting symptoms.  On exam she is afebrile fatigued appearing mildly tachycardic but speaking in full clear sentences.  She is some mild diffuse abdominal tenderness but no grimacing guarding or focal tenderness identified.  She states that all symptoms today feel similar to prior episodes and that usually she gets IV fluids, IV nausea medication and starts to feel much better    Pending blood work, urinalysis.  IV fluids and antiemetics were ordered in triage.  We will continue to closely reassess for improvement in symptoms    Progress Notes     11:34 PM  Blood work reassuring  UA nitrite positive with bacteria.  Will start on antibiotics for UTI  UDS + for marijuana  Suspected cannabis hyperemesis/cyclic vomiting  Plan d/c with additional phenergan  Patient tolerating PO    Disposition     Clinical Impression:       Diagnosis Comment Added By Time Added    Cyclic vomiting syndrome  Maralyn Sago May Bode, PA-C 03/07/2021 10:50 PM          Final Disposition: Discharge      History     Chief Complaint in Triage   Patient presents with   . Nausea, vomiting     N/V since Saturday evening. States similar complaints about 2 and half weeks ago.  Was seen in the ED in July for same symptoms. Attempted zofran PO at home but states vomited it up.        The historian is the patient     HPI:  [name] is a 25 y.o. female with history of anxiety depression, abdominal migraines, who presents to the emergency department for evaluation of vomiting.  She states that over the past 2 days she has been experiencing persistent episodes of nausea and vomiting.   No significant stomach pains only some mild abdominal soreness.  No fevers, no new foods or diet changes.    She states that she has a history of similar symptoms in the past.  Was seen by GI in the past and was diagnosed first with abdominal migraines.  She tells me that her vomiting episodes seem to "go in cycles" and that she has home Zofran but usually when the episodes start to get worse her home medications do not seem to help so she comes to the emergency department.  States that she last had to come to the emergency department in July.    No fevers, no runny nose, no cough.  She did have a little bit of loose stool but nothing persistent, no bloody emesis or diarrhea    She is currently on her menstrual cycle.  She does use marijuana          Review of Systems:  See HPI, all other systems reviewed and are otherwise negative  Constitutional: No fever  Cardiovascular: No chest pain  Respiratory: No shortness of breath  Gastrointestinal: +N/V  Genitourinary: No dysuria    MEDICATIONS:   Discharge Medication List as of 03/07/2021 10:54 PM        START taking these medications  Details   promethazine (PHENERGAN) 25 mg suppository Insert 1 suppository (25 mg total) into the rectum every 6 (six) hours as needed., Starting Mon 03/07/2021, Normal           CONTINUE these medications which have NOT CHANGED    Details   ibuprofen (MOTRIN) 200 MG tablet Take 200 mg by mouth every 6 (six) hours as needed., Historical Med      tramadol (ULTRAM) 50 mg tablet Take 1 tablet (50 mg total) by mouth every 6 (six) hours as needed for pain for up to 10 doses., Starting Thu 06/27/2018, Print             ALLERGIES:   Allergies   Allergen Reactions   . Cat Hair Standardized Allergenic Extract Hives   . Dexedrine [Dextroamphetamine] Other (See Comments)   . Dog Hair Standardized Allergenic Extract Hives       PAST MEDICAL HISTORY:    Past Medical History:   Diagnosis Date   . Anxiety    . Cyclic vomiting syndrome    . Depression    .  Seasonal allergies        PAST SURGICAL HISTORY:   Past Surgical History:   Procedure Laterality Date   . ADENOIDECTOMY     . WISDOM TOOTH EXTRACTION         SOCIAL HISTORY:   Social History     Tobacco Use   . Smoking status: Former   . Smokeless tobacco: Never   Vaping Use   . Vaping Use: Never used   Substance and Sexual Activity   . Alcohol use: Yes     Comment: occassional   . Drug use: Yes     Types: Marijuana   . Sexual activity: Not on file        FAMILY HISTORY:  History reviewed. No pertinent family history.    Physical Exam      ED Triage Vitals [03/07/21 1957]   Temp 98.2 F (36.8 C)   Temp Source Oral   Heart Rate 104   BP 111/65   Respirations 18   SpO2 100 %     Reviewed vital signs and nursing note as charted by RN.    Adult Medical Physical Exam  Reviewed vital signs and nursing note as charted by RN.    CONSTITUTIONAL: Alert and oriented and responds appropriately to questions.  Patient is laying back on stretcher fatigued appearing but speaking in full clear sentences  CARD: Mild tachycardia noted with regular rhythm; no murmurs, no clicks, no rubs, no gallops; symmetric distal pulses  RESP: Normal chest excursion without splinting or tachypnea; breath sounds clear and equal bilaterally; no wheezes, no rhonchi, no rales   ABD/GI: Abdomen is soft, nondistended.  She has some mild reported tenderness throughout but no focal tenderness, no grimacing or guarding.  EXT: Normal ROM in all joints    SKIN: Normal color for age and race  NEURO: Moves all extremities equally; Motor and sensory function intact   PSYCH: The patient's mood and manner are appropriate. Grooming and personal hygiene are appropriate.       Results      I personally reviewed the results in the chart as listed below    Results for orders placed or performed during the hospital encounter of 03/07/21   CBC with Differential   Result Value Ref Range    Differential Percent Diff %     Differential Absolute Diff Absolute     WBC  9.4 3.6  - 11.2 K/uL    RBC 4.42 3.63 - 4.92 M/uL    Hemoglobin 14.0 11.4 - 15.0 g/dL    Hematocrit 41 31 - 42 %    Mean Cell Volume 93 74 - 96 fL    Mean Cell Hemoglobin 32 26 - 33 pg    Mean Cell Hemoglobin Concentration 34 33 - 36 g/dL    RDW 47.8 29.5 - 62.1 %    Platelet Count 283 150 - 450 K/uL    Mean Platelet Volume 7.8 7.5 - 11.2 fL    Neutrophils 72 37 - 80 %    Lymphocytes 21 10 - 50 %    Monocytes 6 0 - 12 %    Eosinophils 1 0 - 7 %    Basophils 0 0 - 2 %    Neutrophils Absolute 6.9 1.3 - 9.0 K/uL    Lymphocytes Absolute 1.9 0.4 - 5.6 K/uL    Monocytes Absolute 0.6 0.0 - 1.3 K/uL    Eosinophils Absolute 0.1 0.0 - 0.8 K/uL    Basophils Absolute 0.0 0.0 - 0.2 K/uL    Nucleated RBCS 0 /100 WBC   CMP   Result Value Ref Range    Sodium 137 136 - 145 mmol/L    Potassium 4.0 3.5 - 5.1 mmol/L    Chloride 105 99 - 108 mmol/L    CO2 21 21 - 31 mmol/L    BUN 11 7 - 25 mg/dL    Creatinine 3.08 6.57 - 1.00 mg/dL    Glucose, Random 86 70 - 199 mg/dL    Calcium, Total 9.6 8.8 - 10.6 mg/dL    Osmolality (calculated) 273 270 - 295 mOsm/kg    Anion Gap 11 3 - 11    Albumin 4.3 3.5 - 5.7 g/dL    Bilirubin, Total 0.7 0.3 - 1.0 mg/dL    Alkaline Phosphatase 28 (L) 34 - 104 IU/L    ALT 16 7 - 52 IU/L    AST 23 13 - 39 IU/L    Protein, Total 7.1 6.4 - 8.9 g/dL    Albumin/Globulin Ratio 1.5 1.2 - 2.3   Lipase   Result Value Ref Range    Lipase 14 8 - 82 U/L   Urinalysis with Reflex Urine Culture    Specimen: Clean Catch   Result Value Ref Range    Color YELLOW Lt Yellow or Yellow    Urine Clarity CLOUDY (A) Clear    Urine Specific Gravity 1.035 1.003 - 1.035    Urine pH 6.0 5.0 - 8.0    Urine Albumin 1+ (A) Negative    Urine Glucose Screen NEGATIVE Negative    Urine Ketones MOD/LARGE (A) Negative    Urine Bilirubin NEGATIVE Negative    Urine Hemoglobin SMALL (A) Negative    Urine Leukocyte Esterase NEGATIVE Negative    Urine Nitrite POSITIVE (A) Negative    Urine Bacteria 3+ (A) Trace /HPF    Urine RBC <3 <3 /HPF    Urine WBC 5-10 (A)  <5 /HPF    Urine Squamous Epithelial Cells <10 <10 /HPF   EGFR (MDRD)   Result Value Ref Range    EGFR >60 mL/min/1.17m2   Urine Drug of Abuse Screen   Result Value Ref Range    Amphetamine, Urine Screen NEGATIVE     Barbiturate, Urine Screen NEGATIVE     Benzodiazepines, Urine Screen NEGATIVE     Cannabinoids, Urine Screen POSITIVE     Cocaine, Urine  Screen NEGATIVE     Methadone, Urine Screen NEGATIVE     Opiate, Urine Screen NEGATIVE     Oxycodone, Urine Screen NEGATIVE     Fentanyl, Urine Screen NEGATIVE    Urine Drug Screen Comment   Result Value Ref Range    Drug Screen Comment See Text    Pregnancy, Urine, Point of Care   Result Value Ref Range    Pregnancy, Urine, Point of Care Negative      Imaging Results    None       ECG Results    None          This documentation was generated through the use of dictation and/or voice recognition software, and as such, may contain spelling or other transcription errors. Any questions regarding the content of this documentation should be directed to the individual who electronically signed.       Maralyn Sago [place], [place]  03/07/21 2335

## 2021-07-26 ENCOUNTER — Emergency Department
Admission: EM | Admit: 2021-07-26 | Discharge: 2021-07-26 | Disposition: A | Discharge status: Home / Self Care | Payer: Self-pay | Attending: Emergency Medicine | Admitting: Emergency Medicine

## 2021-07-26 ENCOUNTER — Emergency Department: Payer: Self-pay

## 2021-07-26 DIAGNOSIS — S20219A Contusion of unspecified front wall of thorax, initial encounter: Secondary | ICD-10-CM | POA: Insufficient documentation

## 2021-07-26 LAB — ECG 12-LEAD
Atrial Rate: 114 {beats}/min
P Axis: 65 degrees
P-R Interval: 132 ms
Q-T Interval: 330 ms
QRS Duration: 66 ms
QTC Calculation (Bezet): 454 ms
R Axis: 26 degrees
T Axis: -8 degrees
Ventricular Rate: 114 {beats}/min

## 2021-07-26 MED ORDER — IBUPROFEN 600 MG PO TABS
600.0000 mg | ORAL_TABLET | Freq: Once | ORAL | Status: AC
Start: 2021-07-26 — End: 2021-07-26
  Administered 2021-07-26: 600 mg via ORAL
  Filled 2021-07-26: qty 1

## 2021-07-26 MED ORDER — IBUPROFEN 600 MG PO TABS
600.0000 mg | ORAL_TABLET | Freq: Three times a day (TID) | ORAL | 0 refills | Status: AC | PRN
Start: 2021-07-26 — End: ?
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
## 2021-07-26 NOTE — Discharge Instructions (Signed)
Dear Ms. Durwin Glaze:    Thank you for choosing the [facility] Emergency Department, the premier emergency department in the [place] area.  I hope your visit today was EXCELLENT. You will receive a survey via text message that will give you the opportunity to provide feedback to your team about your visit. Please do not hesitate to reach out with any questions!    Specific instructions for your visit today:        IF YOU DO NOT CONTINUE TO IMPROVE OR YOUR CONDITION WORSENS, PLEASE CONTACT YOUR DOCTOR OR RETURN IMMEDIATELY TO THE EMERGENCY DEPARTMENT.    Sincerely,  Malva Limes, MD  Attending Emergency Physician  [facility] Emergency Department      OBTAINING A PRIMARY CARE APPOINTMENT    Primary care physicians (PCPs, also known as primary care doctors) are either internists or family medicine doctors. Both types of PCPs focus on health promotion, disease prevention, patient education and counseling, and treatment of acute and chronic medical conditions.    If you need a primary care doctor, please call the below number and ask who is receiving new patients.     [facility]  Telephone:  [phone]  [email or web address]    DOCTOR REFERRALS  Call [phone] (available 24 hours a day, 7 days a week) if you need any further referrals and we can help you find a primary care doctor or specialist.  Also, available online at:  [email or web address]    YOUR CONTACT INFORMATION  Before leaving please check with registration to make sure we have an up-to-date contact number.  You can call registration at [phone] to update your information.  For questions about your hospital bill, please call [phone].  For questions about your Emergency Dept Physician bill please call [phone].      FREE HEALTH SERVICES  If you need help with health or social services, please call 2-1-1 for a free referral to resources in your area.  2-1-1 is a free service connecting people with  information on health insurance, free clinics, pregnancy, mental health, dental care, food assistance, housing, and substance abuse counseling.  Also, available online at:  http://www.[place].org    ORTHOPEDIC INJURY   Please know that significant injuries can exist even when an initial x-ray is read as normal or negative.  This can occur because some fractures (broken bones) are not initially visible on x-rays.  For this reason, close outpatient follow-up with your primary care doctor or bone specialist (orthopedist) is required.    MEDICATIONS AND FOLLOWUP  Please be aware that some prescription medications can cause drowsiness.  Use caution when driving or operating machinery.    The examination and treatment you have received in our Emergency Department is provided on an emergency basis, and is not intended to be a substitute for your primary care physician.  It is important that your doctor checks you again and that you report any new or remaining problems at that time.      ASSISTANCE WITH INSURANCE    Affordable Care Act  [facility])  Call to start or finish an application, compare plans, enroll or ask a question.  [phone]  TTY: ([phone]  Web:  Healthcare.gov    Help Enrolling in [facility]  [employer] [place]  414-401-3770 (TOLL-FREE)  ([phone] (TTY)  Web:  Http://www.coverva.org    Local Help Enrolling in the [facility]  [place] [facility]  850-388-2215 (MAIN)  Email:  health-help@nvfs .org  Web:  BlackjackMyths.is  Address:  [address], [address] [place],  [address]    SEDATING MEDICATIONS  Sedating medications include strong pain medications (e.g. narcotics), muscle relaxers, benzodiazepines (used for anxiety and as muscle relaxers), Benadryl/diphenhydramine and other antihistamines for allergic reactions/itching, and other medications.  If you are unsure if you have received a sedating medication, please ask your physician or nurse.  If you received a sedating  medication: DO NOT drive a car. DO NOT operate machinery. DO NOT perform jobs where you need to be alert.  DO NOT drink alcoholic beverages while taking this medicine.     If you get dizzy, sit or lie down at the first signs. Be careful going up and down stairs.  Be extra careful to prevent falls.     Never give this medicine to others.     Keep this medicine out of reach of children.     Do not take or save old medicines. Throw them away when outdated.     Keep all medicines in a cool, dry place. DO NOT keep them in your bathroom medicine cabinet or in a cabinet above the stove.    MEDICATION REFILLS  Please be aware that we cannot refill any prescriptions through the ER. If you need further treatment from what is provided at your ER visit, please follow up with your primary care doctor or your pain management specialist.    FREESTANDING EMERGENCY DEPARTMENTS OF [facility]  Did you know [facility] has two freestanding ERs located just a few miles away?  [facility] [place] and [facility] [place] have short wait times, easy free parking directly in front of the building and top patient satisfaction scores - and the same Board Certified Emergency Medicine doctors as  [facility].
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
## 2022-10-28 ENCOUNTER — Emergency Department (EMERGENCY_DEPARTMENT_HOSPITAL)
Admission: EM | Admit: 2022-10-28 | Discharge: 2022-10-28 | Disposition: A | Discharge status: Home / Self Care | Payer: Medicaid - Out of State | Source: Home / Self Care | Attending: Emergency Medicine | Admitting: Emergency Medicine

## 2022-10-28 ENCOUNTER — Emergency Department
Admission: EM | Admit: 2022-10-28 | Discharge: 2022-10-28 | Disposition: A | Discharge status: Home / Self Care | Payer: Medicaid - Out of State | Attending: Emergency Medicine | Admitting: Emergency Medicine

## 2022-10-28 DIAGNOSIS — R1084 Generalized abdominal pain: Secondary | ICD-10-CM | POA: Insufficient documentation

## 2022-10-28 DIAGNOSIS — F419 Anxiety disorder, unspecified: Secondary | ICD-10-CM | POA: Insufficient documentation

## 2022-10-28 DIAGNOSIS — R112 Nausea with vomiting, unspecified: Secondary | ICD-10-CM | POA: Insufficient documentation

## 2022-10-28 DIAGNOSIS — T43595A Adverse effect of other antipsychotics and neuroleptics, initial encounter: Secondary | ICD-10-CM | POA: Insufficient documentation

## 2022-10-28 DIAGNOSIS — R079 Chest pain, unspecified: Secondary | ICD-10-CM

## 2022-10-28 DIAGNOSIS — T50905A Adverse effect of unspecified drugs, medicaments and biological substances, initial encounter: Secondary | ICD-10-CM

## 2022-10-28 DIAGNOSIS — F1290 Cannabis use, unspecified, uncomplicated: Secondary | ICD-10-CM | POA: Insufficient documentation

## 2022-10-28 HISTORY — DX: Cyclical vomiting syndrome unrelated to migraine: R11.15

## 2022-10-28 LAB — CBC AND DIFFERENTIAL
Absolute NRBC: 0 10*3/uL (ref 0.00–0.00)
Basophils Absolute Automated: 0.02 10*3/uL (ref 0.00–0.08)
Basophils Automated: 0.2 %
Eosinophils Absolute Automated: 0.06 10*3/uL (ref 0.00–0.44)
Eosinophils Automated: 0.6 %
Hematocrit: 38.6 % (ref 34.7–43.7)
Hgb: 13.8 g/dL (ref 11.4–14.8)
Immature Granulocytes Absolute: 0.04 10*3/uL (ref 0.00–0.07)
Immature Granulocytes: 0.4 %
Instrument Absolute Neutrophil Count: 6.92 10*3/uL — ABNORMAL HIGH (ref 1.10–6.33)
Lymphocytes Absolute Automated: 1.85 10*3/uL (ref 0.42–3.22)
Lymphocytes Automated: 19.6 %
MCH: 30.9 pg (ref 25.1–33.5)
MCHC: 35.8 g/dL (ref 31.5–35.8)
MCV: 86.5 fL (ref 78.0–96.0)
MPV: 9.8 fL (ref 8.9–12.5)
Monocytes Absolute Automated: 0.55 10*3/uL (ref 0.21–0.85)
Monocytes: 5.8 %
Neutrophils Absolute: 6.92 10*3/uL — ABNORMAL HIGH (ref 1.10–6.33)
Neutrophils: 73.4 %
Nucleated RBC: 0 /100 WBC (ref 0.0–0.0)
Platelets: 318 10*3/uL (ref 142–346)
RBC: 4.46 10*6/uL (ref 3.90–5.10)
RDW: 13 % (ref 11–15)
WBC: 9.44 10*3/uL (ref 3.10–9.50)

## 2022-10-28 LAB — HEPATIC FUNCTION PANEL (LFT)
ALT: 24 U/L (ref 0–55)
AST (SGOT): 28 U/L (ref 5–41)
Albumin/Globulin Ratio: 1.1 (ref 0.9–2.2)
Albumin: 4.1 g/dL (ref 3.5–5.0)
Alkaline Phosphatase: 56 U/L (ref 37–117)
Bilirubin Direct: 0.2 mg/dL (ref 0.0–0.5)
Bilirubin Indirect: 0.4 mg/dL (ref 0.2–1.0)
Bilirubin, Total: 0.6 mg/dL (ref 0.2–1.2)
Globulin: 3.8 g/dL — ABNORMAL HIGH (ref 2.0–3.6)
Protein, Total: 7.9 g/dL (ref 6.0–8.3)

## 2022-10-28 LAB — BASIC METABOLIC PANEL
Anion Gap: 13 (ref 5.0–15.0)
BUN: 8 mg/dL (ref 7.0–21.0)
CO2: 16 mEq/L — ABNORMAL LOW (ref 17–29)
Calcium: 9.8 mg/dL (ref 8.5–10.5)
Chloride: 110 mEq/L (ref 99–111)
Creatinine: 0.8 mg/dL (ref 0.4–1.0)
Glucose: 110 mg/dL — ABNORMAL HIGH (ref 70–100)
Potassium: 3.7 mEq/L (ref 3.5–5.3)
Sodium: 139 mEq/L (ref 135–145)
eGFR: >60 mL/min/{1.73_m2} (ref >=60)

## 2022-10-28 LAB — ECG 12-LEAD
Atrial Rate: 97 {beats}/min
IHS MUSE NARRATIVE AND IMPRESSION: NORMAL
P Axis: 64 degrees
P-R Interval: 142 ms
Q-T Interval: 366 ms
QRS Duration: 74 ms
QTC Calculation (Bezet): 464 ms
R Axis: 34 degrees
T Axis: 28 degrees
Ventricular Rate: 97 {beats}/min

## 2022-10-28 LAB — LIPASE: Lipase: 16 U/L (ref 8–78)

## 2022-10-28 LAB — BETA HCG QUANTITATIVE, PREGNANCY: hCG, Quant.: <2.4 m[IU]/mL

## 2022-10-28 MED ORDER — DROPERIDOL 2.5 MG/ML IJ SOLN
1.2500 mg | Freq: Once | INTRAMUSCULAR | Status: AC
Start: 2022-10-28 — End: 2022-10-28
  Administered 2022-10-28: 1.25 mg via INTRAVENOUS
  Filled 2022-10-28: qty 2

## 2022-10-28 MED ORDER — SODIUM CHLORIDE 0.9 % IV BOLUS
1000.0000 mL | Freq: Once | INTRAVENOUS | Status: AC
Start: 2022-10-28 — End: 2022-10-28
  Administered 2022-10-28: 1000 mL via INTRAVENOUS

## 2022-10-28 NOTE — ED Triage Notes (Signed)
Carmen Jacobs 27 yo F came in for N/V since Thursday. Pt unable to keep food or water down. Pt endorses marijuana usage daily. Pt states she cannot keep zofran down either. Pt denies blood in stool and urine. Denies changes in Bms or urinary difficulties. GCS 15. A&Ox4.

## 2022-10-28 NOTE — ED Triage Notes (Signed)
Pt presents to ER c/o anxiety and restlessness from possible drug reaction. Pt reports being seen at this ED at 0800 for nausea and given droperidol. A&O x4, pt arrives tachycardic, restless and anxious, no respiratory distress, skin warm and dry.

## 2022-10-28 NOTE — ED Provider Notes (Signed)
Date Time: 10/28/22 1:41 PM   Patient Name: Carmen Jacobs   Attending Physician: Almira Bar, M.D.    Midlevel Provider Attestation:   The patient was seen and examined by the mid-level (resident, physician assistant or nurse practitioner) and the plan of care was discussed with me. I personally saw the patient and made/approved the management plan and take responsibility for the patient management.  Please see the separately documented midlevel note for additional information including but not limited to full history of present illness, review of systems and comprehensive physical exam.    Selected historical findings: Carmen Jacobs 27 y.o. female with PMH cyclical vomiting syndrome who presents with feeling anxious and jumpy after droperidol given earlyier today in ED for exacerbation of cyclic vomiting. Pt was seen in this ED earlier today with abd pain, n/v and dx of cyclic vomiting exacerbation. Pt discharged home after feeling better after droperidol.  Patient states her abd pain, n/v have not reoccurred, but she felt anxious like "jumping out of my body" with rapid breathing after leaving the ED.    Denies CP, SOB, palpitations, syncope/near syncope, dizziness, HA, throat or mouth swelling, difficulty swallowing, rash, itching.    No prior similar reaction, no known drug allergies.    Had anxiety many years ago, has not been on meds for it in a long while.    No SI.      OF note, symptoms have resolved completely since arriving back in ED.        ROS: Positive and negative ROS elements as per HPI.  All other systems reviewed and negative.    PMH/PSH/FH: Reviewed in chart and verified by me.     Physical examination:    Pulse (!) 117  BP 127/63  Resp 18  SpO2 100 %  Temp 98 F (36.7 C)  Well appearing, NAD, well developed, normocephalic, atraumatic, EOMI, radial pulses intact, CTAB, no wheezes or rhonchi, abdomen soft ND, and NT, moving all extremities equally  Selected findings:   tachycardic but regular rhythm     MDM: Carmen Jacobs 27 y.o. female with PMH cyclical vomiting syndrome who presents with feeling anxious and jumpy after droperidol given earlyier today in ED for exacerbation of cyclic vomiting.   VS initially tachycardic but resolved, Exam as above.   Ddx and not limited to: arrhythmia, drug side effect, allergic rxn, electrolyte abnormality.     I personally reviewed and interpreted EKG. EKG showed sinus rhythm, with no acute ischemia or arrhythmia.   Pt monitored and remained asx in ED.   No arrhythmias on tele in ED.   Prior labs today reviewed and unremarkable.   Likely with drug side effect from droperidol.   No concern for infection.     Stable for [place] with plan for OTC meds for symptomatic care and close PMD follow up.   Patient given strict return precautions.  Patient agrees with plan, follow up and understands return precautions.    Medical Decision Making  Problems Addressed:  Adverse effect of drug, initial encounter: acute illness or injury  Anxiety: acute illness or injury    Amount and/or Complexity of Data Reviewed  External Data Reviewed: labs, ECG and notes.  ECG/medicine tests: ordered and independent interpretation performed. Decision-making details documented in ED Course.    Risk  OTC drugs.        O2 Sat:  The patient's oxygen saturation was 96 % on room air. This was independently interpreted by me  as Normal.     EKG-  Interpreted by me: Rate 97, rhythm sinus, no ST elevation, intervals normal, axis normal    Cardiac Monitoring: I independely reviewed and interpreted the patient's cardiac monitoring as sinus tachycardia at 120.    Clinical Course:     ____________________________________________________________________    I was acting as a scribe for Irena Cords, Alycia Patten, MD on Carmen Jacobs  Treatment Team: Scribe: Kathrynn Running     I am the first provider for this patient and I personally performed the services documented. Kathrynn Running is  scribing for me on [facility]. This note and the patient instructions accurately reflect work and decisions made by me.  Irena Cords, Alycia Patten, MD  ____________________________________________________________________   Note: parts of this note were generated by the Epic EMR system/ Dragon speech recognition and may contain inherent errors or omissions not intended by the user. Grammatical errors, random word insertions, deletions, pronoun errors and incomplete sentences are occasional consequences of this technology due to software limitations. Not all errors are caught or corrected. If there are questions or concerns about the content of this note or information contained within the body of this dictation they should be addressed directly with the author for clarification.     Irena Cords, Alycia Patten, MD  10/30/22 [phone]

## 2022-10-28 NOTE — ED Provider Notes (Signed)
[facility] EMERGENCY DEPARTMENT  ADVANCED PRACTICE PROVIDER HISTORY AND PHYSICAL EXAM     Patient Name: Carmen Jacobs, Carmen Jacobs  Department:[facility] EMERGENCY DEPT  Encounter Date:  10/28/2022  Attending Physician: No att. providers found   Age: 27 y.o. female  Patient Room: Amada Kingfisher  PCP: Pcp, None, MD        Diagnosis/Disposition:     Final diagnoses:   Adverse effect of drug, initial encounter   Anxiety       ED Disposition       ED Disposition   Discharge    Condition   --    Date/Time   Sat Oct 28, 2022  2:37 PM    Comment   Vermelle Duffy Bruce discharge to home/self care.    Condition at disposition: Stable                 Follow-Up Providers (if applicable)    [facility]  [address]  [place] [address]  [phone]  Schedule an appointment as soon as possible for a visit   For re-evaluation    [facility] Emergency Dept  [address]  [place] [address]  [phone]  Go to   As needed, If symptoms worsen       New Prescriptions    No medications on file           Medical Decision Making:     Initial Differential Diagnosis:  Initial differential diagnosis to include but not limited to:  medication reaction, anxiety, arrhythmia     Plan:  Patient declines medications as she now feels completely back to her usual self   IVF, ECG    Final Impression:  HR normalized with IVF, ECG NSR, reviewed strict rted precautions, pcp follow up, pt verbalizes understanding and is agreeable to plan and [place] home   The patient was deemed stable for discharge. They were given strict return precautions as it relates to their presumed diagnosis, verbalized understanding of these precautions and agreed to follow up as instructed. All questions were answered prior to discharge.         Medical Decision Making  Amount and/or Complexity of Data Reviewed  ECG/medicine tests: ordered.     Details: NSR rate of 97, qtc 464                              History of Presenting Illness:      Nursing Triage note: Patient wheeled to triage and reports "very anxious" and feels like "jumping out of my body". Patient was here in ED earlier for abdominal pain and vomiting, was given Droperidol and believes she is having adverse reactions from the medicine after checking online. Patient denies any past medical or surgical history and not on any medications. Patient is alert and oriented x 4 with patent airway.  Chief complaint: Anxiety    Carmen Jacobs is a 27 y.o. female with a PMH of cyclical vomiting syndrome who was here earlier with abd pain, n/v and discharged home after feeling better after droperidol.  Patient states her abd pain, n/v have not reoccurred, but she felt anxious like "jumping out of my body" with rapid breathing after leaving the ED.  No CP, palpitations, dizziness, HA, sore throat, throat or mouth swelling, difficulty swallowing, rash, itching.  No prior similar reaction, no known drug allergies.  Had anxiety many years ago, has  not been on meds for it in a long while.  No SI.  Symptoms have resolved completely since arriving back in ED.            Review of Systems:  Physical Exam:     Review of Systems   Constitutional:  Negative for chills and fever.   HENT:  Negative for drooling, sore throat, trouble swallowing and voice change.    Eyes:  Negative for visual disturbance.   Respiratory:  Positive for shortness of breath. Negative for cough.    Cardiovascular:  Negative for chest pain.   Gastrointestinal:  Positive for nausea and vomiting.   Genitourinary:  Negative for difficulty urinating.   Musculoskeletal:  Negative for myalgias.   Skin:  Negative for rash.   Neurological:  Negative for light-headedness.   Psychiatric/Behavioral:  Negative for suicidal ideas. The patient is nervous/anxious.        Positive and negative ROS per above and in HPI. All other systems reviewed and negative.     Pulse (!) 117  BP 127/63  Resp 18  SpO2 100 %  Temp 98 F (36.7 C)      Physical Exam  Vitals and nursing note reviewed.   Constitutional:       General: She is not in acute distress.     Appearance: Normal appearance. She is normal weight. She is not ill-appearing, toxic-appearing or diaphoretic.   HENT:      Head: Normocephalic and atraumatic.   Eyes:      Conjunctiva/sclera: Conjunctivae normal.   Cardiovascular:      Rate and Rhythm: Regular rhythm. Tachycardia present.   Pulmonary:      Effort: Pulmonary effort is normal. No respiratory distress.   Abdominal:      General: Abdomen is flat.   Musculoskeletal:         General: Normal range of motion.      Cervical back: Normal range of motion.   Skin:     General: Skin is warm and dry.      Findings: No rash.   Neurological:      General: No focal deficit present.      Mental Status: She is alert and oriented to person, place, and time.   Psychiatric:         Attention and Perception: Attention normal.         Mood and Affect: Mood normal. Mood is not anxious.         Speech: Speech normal.         Behavior: Behavior is cooperative.             Interpretations, Clinical Decision Tools and Critical Care:     O2 Sat:  The patient's oxygen saturation was 100 % on room air. This was independently interpreted by me as Normal.            Procedures:   Procedures      Attestations:     Documentation Notes:  Parts of this note were generated by the Epic EMR system/ Dragon speech recognition and may contain inherent errors or omissions not intended by the user. Grammatical errors, random word insertions, deletions, pronoun errors and incomplete sentences are occasional consequences of this technology due to software limitations. Not all errors are caught or corrected.  My documentation is often completed after the patient is no longer under my clinical care. In some cases, the Epic EMR may pull updated results into the above documentation which may  not reflect all results or information that were available to me at the time of my medical  decision making.   If there are questions or concerns about the content of this note or information contained within the body of this dictation they should be addressed directly with the author for clarification.                  Cecille Po Collins, [place]  10/28/22 1544

## 2022-10-28 NOTE — Discharge Instructions (Addendum)
Dear [name]:    Thank you for choosing the [facility] Emergency Department, the premier emergency department in the [place] area.  I hope your visit today was EXCELLENT. You will receive a survey via text message that will give you the opportunity to provide feedback to your team about your visit. Please do not hesitate to reach out with any questions!    Specific instructions for your visit today:    Follow up with your PCP, and return to the ER for any new or worsening symptoms.    IF YOU DO NOT CONTINUE TO IMPROVE OR YOUR CONDITION WORSENS, PLEASE CONTACT YOUR DOCTOR OR RETURN IMMEDIATELY TO THE EMERGENCY DEPARTMENT.    Sincerely,  Irena Cords, Alycia Patten, MD  Attending Emergency Physician  [facility] Emergency Department      OBTAINING A PRIMARY CARE APPOINTMENT    Primary care physicians (PCPs, also known as primary care doctors) are either internists or family medicine doctors. Both types of PCPs focus on health promotion, disease prevention, patient education and counseling, and treatment of acute and chronic medical conditions.    If you need a primary care doctor, please call the below number and ask who is receiving new patients.      [facility] Group  Telephone:  [phone]  [email or web address]    DOCTOR REFERRALS  Call [phone] (available 24 hours a day, 7 days a week) if you need any further referrals and we can help you find a primary care doctor or specialist.  Also, available online at:  [email or web address]    YOUR CONTACT INFORMATION  Before leaving please check with registration to make sure we have an up-to-date contact number.  You can call registration at [phone] to update your information.  For questions about your hospital bill, please call [phone].  For questions about your Emergency Dept Physician bill please call [phone].      FREE HEALTH SERVICES  If you need help with health or social services, please call 2-1-1 for a free  referral to resources in your area.  2-1-1 is a free service connecting people with information on health insurance, free clinics, pregnancy, mental health, dental care, food assistance, housing, and substance abuse counseling.  Also, available online at:  http://www.[place].org    ORTHOPEDIC INJURY   Please know that significant injuries can exist even when an initial x-ray is read as normal or negative.  This can occur because some fractures (broken bones) are not initially visible on x-rays.  For this reason, close outpatient follow-up with your primary care doctor or bone specialist (orthopedist) is required.    MEDICATIONS AND FOLLOWUP  Please be aware that some prescription medications can cause drowsiness.  Use caution when driving or operating machinery.    The examination and treatment you have received in our Emergency Department is provided on an emergency basis, and is not intended to be a substitute for your primary care physician.  It is important that your doctor checks you again and that you report any new or remaining problems at that time.      ASSISTANCE WITH INSURANCE    Affordable Care Act  [facility])  Call to start or finish an application, compare plans, enroll or ask a question.  [phone]  TTY: [phone]  Web:  Healthcare.gov    Help Enrolling in [facility]  [place]  (249)565-5760 (TOLL-FREE)  ([phone] [phone] (TTY)  Web:  Http://www.coverva.org    Local Help Enrolling in the [facility]  [place]  Family Service  ([phone] (MAIN)  Email:  health-help@nvfs .org  Web:  http://lewis-perez.info/  Address:  [address] [place], [place] [address]    SEDATING MEDICATIONS  Sedating medications include strong pain medications (e.g. narcotics), muscle relaxers, benzodiazepines (used for anxiety and as muscle relaxers), Benadryl/diphenhydramine and other antihistamines for allergic reactions/itching, and other medications.  If you are unsure if you have received a sedating  medication, please ask your physician or nurse.  If you received a sedating medication: DO NOT drive a car. DO NOT operate machinery. DO NOT perform jobs where you need to be alert.  DO NOT drink alcoholic beverages while taking this medicine.     If you get dizzy, sit or lie down at the first signs. Be careful going up and down stairs.  Be extra careful to prevent falls.     Never give this medicine to others.     Keep this medicine out of reach of children.     Do not take or save old medicines. Throw them away when outdated.     Keep all medicines in a cool, dry place. DO NOT keep them in your bathroom medicine cabinet or in a cabinet above the stove.    MEDICATION REFILLS  Please be aware that we cannot refill any prescriptions through the ER. If you need further treatment from what is provided at your ER visit, please follow up with your primary care doctor or your pain management specialist.    [place]  Did you know [facility] has two freestanding ERs located just a few miles away?  [facility] [place] [facility] [place] have short wait times, easy free parking directly in front of the building and top patient satisfaction scores - and the same Board Certified Emergency Medicine doctors as [facility].

## 2022-10-28 NOTE — ED Provider Notes (Signed)
[facility] EMERGENCY DEPARTMENT  ATTENDING PHYSICIAN HISTORY AND PHYSICAL EXAM     Patient Name: Carmen Jacobs, Carmen Jacobs  Department: [facility] EMERGENCY DEPT  Encounter Date:  10/28/2022  Attending Physician: Reeves Forth., MD   Age: 27 y.o. female  Patient Room: S C1/S C1  PCP: Pcp, None, MD           Diagnosis/Disposition:     Final diagnoses:   Generalized abdominal pain   Nausea and vomiting, unspecified vomiting type   Marijuana use       ED Disposition       ED Disposition   Discharge    Condition   --    Date/Time   Sat Oct 28, 2022 10:07 AM    Comment   Carmen Jacobs discharge to home/self care.    Condition at disposition: Stable                 Follow-Up Providers (if applicable)    your primary    In 3 days         Discharge Medication List as of 10/28/2022 10:07 AM              Medical Decision Making:            Medical Decision Making  Based on the patient's history and evaluation appeared to be continuation of her ongoing GI issues.  Multiple studies were obtained which were nondiagnostic for any acute issues.  Patient was given IV fluids and droperidol.  On reassessment is able to tolerate p.o.  Encouraged the patient to stop using marijuana, and to keep not using marijuana as it will take many months before her symptoms resolved if it is due to the cannabis.  Patient is being discharged stable condition.    Amount and/or Complexity of Data Reviewed  Labs: ordered.    Risk  Prescription drug management.                            History of Presenting Illness:     Nursing Triage note: Arrives with [facility] to triage, Patient report vomitting, diarrhea and lower abdominal pain since yesterday. Not able to keep any water down, hx of sickle vomiting syndrome. Denies any fever.  Chief complaint: Abdominal Pain and Emesis    Carmen Jacobs is a 27 y.o. female who presents to the emergency department today with a 2-day history of abdominal pain, nausea, vomiting, diarrhea.  Patient states she smokes  marijuana on a daily basis.  Has been using Zofran for her nausea without improvement.  Otherwise, patient without complaint.          Review of Systems:  Physical Exam:       Positive and negative ROS per HPI.     Pulse (!) 106  BP 118/76  Resp 20  SpO2 98 %  Temp 97.5 F (36.4 C)     Physical Exam:  Constitutional: healthy appearing female, laying on a gurney, in no acute distress  Head: Normocephalic, atraumatic  Eyes: EOMI.  Sclera not pale, injected.  ENT: Mucous membranes moist.  Nose symmetric.  Neck: Normal range of motion. Non-tender.  Respiratory/Chest: Clear to auscultation. No respiratory distress.   Cardiovascular: RRR w/o murmur/gallop  Abdomen: Soft and non-tender. No guarding. No masses or hepatosplenomegaly.  Musculoskeletal: No edema. No cyanosis.  Neurological: CN II-XII intact grossly. No focal motor deficits by observation. Speech normal.  Skin: Warm and dry.  No rash.  Psychiatric: Normal affect. Normal concentration.             Interpretations, Clinical Decision Tools and Critical Care:                    Procedures:   Procedures      Attestations:     Scribe Attestation: There was no scribe involved in the care of this patient.     Documentation Notes:  Parts of this note were generated by the Epic EMR system/ Dragon speech recognition and may contain inherent errors or omissions not intended by the user. Grammatical errors, random word insertions, deletions, pronoun errors and incomplete sentences are occasional consequences of this technology due to software limitations. Not all errors are caught or corrected.  My documentation is often completed after the patient is no longer under my clinical care. In some cases, the Epic EMR may pull updated results into the above documentation which may not reflect all results or information that were available to me at the time of my medical decision making.   If there are questions or concerns about the content of this note or information  contained within the body of this dictation they should be addressed directly with the author for clarification.

## 2022-10-28 NOTE — Discharge Instructions (Signed)
Dear [name]:    Thank you for choosing the [facility] Emergency Department, the premier emergency department in the [place] area.  I hope your visit today was EXCELLENT. You will receive a survey via text message that will give you the opportunity to provide feedback to your team about your visit. Please do not hesitate to reach out with any questions!    Specific instructions for your visit today:      =========    Stop using marijuana.  You have to stop using for about 3 months, if not longer, before you will have benefit.  Once you feel better do not start using marijuana again as you will likely wind back up in the same position.    =========    IF YOU DO NOT CONTINUE TO IMPROVE OR YOUR CONDITION WORSENS, PLEASE CONTACT YOUR DOCTOR OR RETURN IMMEDIATELY TO THE EMERGENCY DEPARTMENT.    Sincerely,  Carmen Jacobs, Carmen Jacobs., MD  Attending Emergency Physician  [facility] Emergency Department      OBTAINING A PRIMARY CARE APPOINTMENT    Primary care physicians (PCPs, also known as primary care doctors) are either internists or family medicine doctors. Both types of PCPs focus on health promotion, disease prevention, patient education and counseling, and treatment of acute and chronic medical conditions.    If you need a primary care doctor, please call the below number and ask who is receiving new patients.     [facility]  Telephone:  [phone]  [email or web address]    DOCTOR REFERRALS  Call [phone] (available 24 hours a day, 7 days a week) if you need any further referrals and we can help you find a primary care doctor or specialist.  Also, available online at:  [email or web address]    YOUR CONTACT INFORMATION  Before leaving please check with registration to make sure we have an up-to-date contact number.  You can call registration at [phone] to update your information.  For questions about your hospital bill, please call [phone].  For questions  about your Emergency Dept Physician bill please call [phone].      FREE HEALTH SERVICES  If you need help with health or social services, please call 2-1-1 for a free referral to resources in your area.  2-1-1 is a free service connecting people with information on health insurance, free clinics, pregnancy, mental health, dental care, food assistance, housing, and substance abuse counseling.  Also, available online at:  http://www.211[place].org    ORTHOPEDIC INJURY   Please know that significant injuries can exist even when an initial x-ray is read as normal or negative.  This can occur because some fractures (broken bones) are not initially visible on x-rays.  For this reason, close outpatient follow-up with your primary care doctor or bone specialist (orthopedist) is required.    MEDICATIONS AND FOLLOWUP  Please be aware that some prescription medications can cause drowsiness.  Use caution when driving or operating machinery.    The examination and treatment you have received in our Emergency Department is provided on an emergency basis, and is not intended to be a substitute for your primary care physician.  It is important that your doctor checks you again and that you report any new or remaining problems at that time.      ASSISTANCE WITH INSURANCE    Affordable Care Act  [facility])  Call to start or finish an application, compare plans, enroll or ask a question.  [phone]  TTY: [phone]  Web:  Healthcare.gov    Help Enrolling in [facility]  [place]  813-097-6383 (TOLL-FREE)  419-321-4656 (TTY)  Web:  Http://www.coverva.org    Local Help Enrolling in the [facility]  [place] Family Service  603-716-0157 (MAIN)  Email:  health-help@nvfs .org  Web:  BlackjackMyths.is  Address:  [address], [address] [place], [place] [address]    SEDATING MEDICATIONS  Sedating medications include strong pain medications (e.g. narcotics), muscle relaxers, benzodiazepines (used for anxiety and as  muscle relaxers), Benadryl/diphenhydramine and other antihistamines for allergic reactions/itching, and other medications.  If you are unsure if you have received a sedating medication, please ask your physician or nurse.  If you received a sedating medication: DO NOT drive a car. DO NOT operate machinery. DO NOT perform jobs where you need to be alert.  DO NOT drink alcoholic beverages while taking this medicine.     If you get dizzy, sit or lie down at the first signs. Be careful going up and down stairs.  Be extra careful to prevent falls.     Never give this medicine to others.     Keep this medicine out of reach of children.     Do not take or save old medicines. Throw them away when outdated.     Keep all medicines in a cool, dry place. DO NOT keep them in your bathroom medicine cabinet or in a cabinet above the stove.    MEDICATION REFILLS  Please be aware that we cannot refill any prescriptions through the ER. If you need further treatment from what is provided at your ER visit, please follow up with your primary care doctor or your pain management specialist.    FREESTANDING EMERGENCY DEPARTMENTS OF [facility]  Did you know [facility] has two freestanding ERs located just a few miles away?  [place] [facility] [place] and [place] [facility] [place] have short wait times, easy free parking directly in front of the building and top patient satisfaction scores - and the same Board Certified Emergency Medicine doctors as [facility].
